# Patient Record
Sex: Female | Born: 1960 | Race: White | Hispanic: No | Marital: Married | Smoking: Former smoker
Health system: Southern US, Community
[De-identification: ages and names within clinical notes are randomized; demographics above are authoritative.]

## PROBLEM LIST (undated history)

## (undated) DIAGNOSIS — F419 Anxiety disorder, unspecified: Secondary | ICD-10-CM

## (undated) DIAGNOSIS — K219 Gastro-esophageal reflux disease without esophagitis: Secondary | ICD-10-CM

---

## 2016-09-07 ENCOUNTER — Other Ambulatory Visit: Payer: Self-pay

## 2016-09-07 ENCOUNTER — Telehealth: Payer: Self-pay

## 2016-09-07 DIAGNOSIS — Z1211 Encounter for screening for malignant neoplasm of colon: Secondary | ICD-10-CM

## 2016-09-07 NOTE — Telephone Encounter (Signed)
Gastroenterology Pre-Procedure Review  Request Date:  Requesting Physician: Dr.   PATIENT REVIEW QUESTIONS: The patient responded to the following health history questions as indicated:    1. Are you having any GI issues? no 2. Do you have a personal history of Polyps? no 3. Do you have a family history of Colon Cancer or Polyps? no 4. Diabetes Mellitus? no 5. Joint replacements in the past 12 months?no 6. Major health problems in the past 3 months?no 7. Any artificial heart valves, MVP, or defibrillator?no    MEDICATIONS & ALLERGIES:    Patient reports the following regarding taking any anticoagulation/antiplatelet therapy:   Plavix, Coumadin, Eliquis, Xarelto, Lovenox, Pradaxa, Brilinta, or Effient? no Aspirin? no  Patient confirms/reports the following medications:  No current outpatient prescriptions on file.   No current facility-administered medications for this visit.     Patient confirms/reports the following allergies:  Allergies  Allergen Reactions  . Codeine     Dizziness     No orders of the defined types were placed in this encounter.   AUTHORIZATION INFORMATION Primary Insurance: 1D#: Group #:  Secondary Insurance: 1D#: Group #:  SCHEDULE INFORMATION: Date: 11/09/16 Time: Location: [place]

## 2016-09-11 ENCOUNTER — Telehealth: Payer: Self-pay | Admitting: Gastroenterology

## 2016-09-11 NOTE — Telephone Encounter (Signed)
09/11/16 Spoke with Ulis Rias at [place] and NO prior auth required for Colonoscopy / Z12.11 screening

## 2016-11-05 ENCOUNTER — Telehealth: Payer: Self-pay | Admitting: Gastroenterology

## 2016-11-05 NOTE — Telephone Encounter (Signed)
Patient left a voice message that she needs to reschedule. Please call

## 2016-11-05 NOTE — Telephone Encounter (Signed)
Patient is returning your call regarding scheduling a colonoscopy. Please call

## 2016-11-06 NOTE — Telephone Encounter (Signed)
Patient changed procedure date to 7/6.  Mailed new prep packet.

## 2016-11-07 ENCOUNTER — Telehealth: Payer: Self-pay | Admitting: Gastroenterology

## 2016-11-07 NOTE — Telephone Encounter (Signed)
Patient needs to r/s her procedure again. She won't be back from vacation.

## 2016-11-15 ENCOUNTER — Encounter
Admission: RE | Disposition: A | Discharge status: Home / Self Care | Payer: Self-pay | Source: Ambulatory Visit | Attending: Gastroenterology

## 2016-11-15 ENCOUNTER — Ambulatory Visit: Payer: BC Managed Care – PPO | Admitting: Anesthesiology

## 2016-11-15 ENCOUNTER — Encounter: Payer: Self-pay | Admitting: *Deleted

## 2016-11-15 ENCOUNTER — Ambulatory Visit
Admission: RE | Admit: 2016-11-15 | Discharge: 2016-11-15 | Disposition: A | Discharge status: Home / Self Care | Payer: BC Managed Care – PPO | Source: Ambulatory Visit | Attending: Gastroenterology | Admitting: Gastroenterology

## 2016-11-15 DIAGNOSIS — K640 First degree hemorrhoids: Secondary | ICD-10-CM | POA: Diagnosis not present

## 2016-11-15 DIAGNOSIS — F419 Anxiety disorder, unspecified: Secondary | ICD-10-CM | POA: Diagnosis not present

## 2016-11-15 DIAGNOSIS — D122 Benign neoplasm of ascending colon: Secondary | ICD-10-CM | POA: Insufficient documentation

## 2016-11-15 DIAGNOSIS — Z885 Allergy status to narcotic agent status: Secondary | ICD-10-CM | POA: Diagnosis not present

## 2016-11-15 DIAGNOSIS — K219 Gastro-esophageal reflux disease without esophagitis: Secondary | ICD-10-CM | POA: Diagnosis not present

## 2016-11-15 DIAGNOSIS — Z87891 Personal history of nicotine dependence: Secondary | ICD-10-CM | POA: Diagnosis not present

## 2016-11-15 DIAGNOSIS — Z1211 Encounter for screening for malignant neoplasm of colon: Secondary | ICD-10-CM | POA: Diagnosis not present

## 2016-11-15 DIAGNOSIS — Z79899 Other long term (current) drug therapy: Secondary | ICD-10-CM | POA: Insufficient documentation

## 2016-11-15 HISTORY — DX: Gastro-esophageal reflux disease without esophagitis: K21.9

## 2016-11-15 HISTORY — PX: COLONOSCOPY WITH PROPOFOL: SHX5780

## 2016-11-15 HISTORY — DX: Anxiety disorder, unspecified: F41.9

## 2016-11-15 SURGERY — COLONOSCOPY WITH PROPOFOL
Anesthesia: General

## 2016-11-15 MED ORDER — LIDOCAINE HCL (CARDIAC) 20 MG/ML IV SOLN
INTRAVENOUS | Status: DC | PRN
  Administered 2016-11-15: 80 mg via INTRAVENOUS

## 2016-11-15 MED ORDER — PROPOFOL 500 MG/50ML IV EMUL
INTRAVENOUS | Status: AC
  Filled 2016-11-15: qty 50

## 2016-11-15 MED ORDER — LIDOCAINE HCL (PF) 2 % IJ SOLN
INTRAMUSCULAR | Status: AC
  Filled 2016-11-15: qty 2

## 2016-11-15 MED ORDER — PROPOFOL 10 MG/ML IV BOLUS
INTRAVENOUS | Status: DC | PRN
  Administered 2016-11-15: 60 mg via INTRAVENOUS

## 2016-11-15 MED ORDER — SODIUM CHLORIDE 0.9 % IV SOLN
INTRAVENOUS | Status: DC
  Administered 2016-11-15 (×3): via INTRAVENOUS

## 2016-11-15 MED ORDER — PROPOFOL 500 MG/50ML IV EMUL
INTRAVENOUS | Status: DC | PRN
  Administered 2016-11-15: 150 ug/kg/min via INTRAVENOUS

## 2016-11-15 MED ORDER — ONDANSETRON HCL 4 MG/2ML IJ SOLN
INTRAMUSCULAR | Status: AC
  Filled 2016-11-15: qty 2

## 2016-11-15 NOTE — Anesthesia Preprocedure Evaluation (Signed)
Anesthesia Evaluation  Patient identified by MRN, date of birth, ID band Patient awake    Reviewed: Allergy & Precautions, NPO status , Patient's Chart, lab work & pertinent test results  Airway Mallampati: III  TM Distance: <3 FB Neck ROM: Full    Dental  (+) Caps   Pulmonary former smoker,    Pulmonary exam normal        Cardiovascular negative cardio ROS Normal cardiovascular exam     Neuro/Psych Anxiety negative neurological ROS     GI/Hepatic Neg liver ROS, GERD  Medicated,  Endo/Other  negative endocrine ROS  Renal/GU negative Renal ROS  negative genitourinary   Musculoskeletal negative musculoskeletal ROS (+)   Abdominal Normal abdominal exam  (+)   Peds negative pediatric ROS (+)  Hematology negative hematology ROS (+)   Anesthesia Other Findings   Reproductive/Obstetrics                             Anesthesia Physical Anesthesia Plan  ASA: II  Anesthesia Plan: General   Post-op Pain Management:    Induction: Intravenous  PONV Risk Score and Plan:   Airway Management Planned: Nasal Cannula  Additional Equipment:   Intra-op Plan:   Post-operative Plan:   Informed Consent: I have reviewed the patients History and Physical, chart, labs and discussed the procedure including the risks, benefits and alternatives for the proposed anesthesia with the patient or authorized representative who has indicated his/her understanding and acceptance.   Dental advisory given  Plan Discussed with: CRNA and Surgeon  Anesthesia Plan Comments:         Anesthesia Quick Evaluation

## 2016-11-15 NOTE — Transfer of Care (Signed)
Immediate Anesthesia Transfer of Care Note  Patient: Traci Gillespie  Procedure(s) Performed: Procedure(s): COLONOSCOPY WITH PROPOFOL (N/A)  Patient Location: Endoscopy Unit  Anesthesia Type:General  Level of Consciousness: drowsy and patient cooperative  Airway & Oxygen Therapy: Patient Spontanous Breathing and Patient connected to nasal cannula oxygen  Post-op Assessment: Report given to RN and Post -op Vital signs reviewed and stable  Post vital signs: Reviewed and stable  Last Vitals:  Vitals:   11/15/16 1031  BP: 135/85  Pulse: 73  Resp: 18  Temp: (!) 36 C    Last Pain:  Vitals:   11/15/16 1031  TempSrc: Tympanic         Complications: No apparent anesthesia complications

## 2016-11-15 NOTE — Anesthesia Post-op Follow-up Note (Cosign Needed)
Anesthesia QCDR form completed.        

## 2016-11-15 NOTE — Op Note (Signed)
[facility] Gastroenterology Patient Name: Traci Gillespie Procedure Date: 11/15/2016 12:39 PM MRN: [id number] Account #: [id number] Date of Birth: [birth year] Admit Type: Outpatient Age: 56 Room: [facility] ENDO ROOM 4 Gender: Female Note Status: Finalized Procedure:            Colonoscopy Indications:          Screening for colorectal malignant neoplasm Providers:            Jonathon Bellows MD, MD Referring MD:         Kerin Perna MD, MD (Referring MD) Medicines:            Monitored Anesthesia Care Complications:        No immediate complications. Procedure:            Pre-Anesthesia Assessment:                       - Prior to the procedure, a History and Physical was                        performed, and patient medications, allergies and                        sensitivities were reviewed. The patient's tolerance of                        previous anesthesia was reviewed.                       - The risks and benefits of the procedure and the                        sedation options and risks were discussed with the                        patient. All questions were answered and informed                        consent was obtained.                       - ASA Grade Assessment: II - A patient with mild                        systemic disease.                       After obtaining informed consent, the colonoscope was                        passed under direct vision. Throughout the procedure,                        the patient's blood pressure, pulse, and oxygen                        saturations were monitored continuously. The                        Colonoscope was introduced through the anus and  advanced to the the cecum, identified by the                        appendiceal orifice, IC valve and transillumination.                        The colonoscopy was performed with ease. The patient                        tolerated the procedure well. The  quality of the bowel                        preparation was good. Findings:      A 5 mm polyp was found in the ascending colon. The polyp was sessile.       The polyp was removed with a cold snare. Resection and retrieval were       complete.      Non-bleeding internal hemorrhoids were found during retroflexion. The       hemorrhoids were small and Grade I (internal hemorrhoids that do not       prolapse).      The exam was otherwise without abnormality on direct and retroflexion       views. Impression:           - One 5 mm polyp in the ascending colon, removed with a                        cold snare. Resected and retrieved.                       - Non-bleeding internal hemorrhoids.                       - The examination was otherwise normal on direct and                        retroflexion views. Recommendation:       - Discharge patient to home (with escort).                       - Resume previous diet.                       - Continue present medications.                       - Await pathology results.                       - Repeat colonoscopy in 5-10 years for surveillance                        based on pathology results. Procedure Code(s):    --- Professional ---                       [phone], Colonoscopy, flexible; with removal of tumor(s),                        polyp(s), or other lesion(s) by snare technique Diagnosis Code(s):    --- Professional ---  Z12.11, Encounter for screening for malignant neoplasm                        of colon                       D12.2, Benign neoplasm of ascending colon                       K64.0, First degree hemorrhoids CPT copyright 2016 American Medical Association. All rights reserved. The codes documented in this report are preliminary and upon coder review may  be revised to meet current compliance requirements. Jonathon Bellows, MD Jonathon Bellows MD, MD 11/15/2016 1:12:27 PM This report has been signed  electronically. Number of Addenda: 0 Note Initiated On: 11/15/2016 12:39 PM Scope Withdrawal Time: 0 hours 16 minutes 59 seconds  Total Procedure Duration: 0 hours 25 minutes 21 seconds       [facility]

## 2016-11-15 NOTE — Anesthesia Postprocedure Evaluation (Signed)
Anesthesia Post Note  Patient: Museum/gallery conservator  Procedure(s) Performed: Procedure(s) (LRB): COLONOSCOPY WITH PROPOFOL (N/A)  Patient location during evaluation: Endoscopy Anesthesia Type: General Level of consciousness: awake and alert and oriented Pain management: pain level controlled Vital Signs Assessment: post-procedure vital signs reviewed and stable Respiratory status: spontaneous breathing, nonlabored ventilation and respiratory function stable Cardiovascular status: blood pressure returned to baseline and stable Postop Assessment: no signs of nausea or vomiting Anesthetic complications: no     Last Vitals:  Vitals:   11/15/16 1335 11/15/16 1345  BP: 118/60 129/74  Pulse: 74 72  Resp: 13 (!) 22  Temp:      Last Pain:  Vitals:   11/15/16 1315  TempSrc: Tympanic  PainSc: Asleep                 Theophilus Walz

## 2016-11-18 NOTE — H&P (Signed)
  Jonathon Bellows MD [address], [place], [place] [address] Phone: [phone] Fax : [phone]  Primary Care Physician:  Hortencia Pilar, MD Primary Gastroenterologist:  Dr. Jonathon Bellows   Pre-Procedure History & Physical: HPI:  Traci Gillespie is a 56 y.o. female is here for an colonoscopy.   Past Medical History:  Diagnosis Date  . Anxiety   . GERD (gastroesophageal reflux disease)     Past Surgical History:  Procedure Laterality Date  . CESAREAN SECTION      Prior to Admission medications   Medication Sig Start Date End Date Taking? Authorizing Provider  busPIRone (BUSPAR) 10 MG tablet Take 10 mg by mouth daily.   Yes [provider]  ranitidine (ZANTAC) 300 MG tablet  08/30/16  Yes [provider]  simvastatin (ZOCOR) 20 MG tablet  08/30/16   [provider]    Allergies as of 09/07/2016 - never reviewed  Allergen Reaction Noted  . Codeine  09/07/2016    History reviewed. No pertinent family history.  Social History   Social History  . Marital status: Married    Spouse name: N/A  . Number of children: N/A  . Years of education: N/A   Occupational History  . Not on file.   Social History Main Topics  . Smoking status: Former Research scientist (life sciences)  . Smokeless tobacco: Never Used  . Alcohol use Yes  . Drug use: No  . Sexual activity: Not on file   Other Topics Concern  . Not on file   Social History Narrative  . No narrative on file    Review of Systems: See HPI, otherwise negative ROS  Physical Exam: BP 129/74   Pulse 72   Temp 97.3 F (36.3 C) (Tympanic)   Resp (!) 22   Ht 5\' 9"  (1.753 m)   Wt 173 lb 12.8 oz (78.8 kg)   SpO2 97%   BMI 25.67 kg/m  General:   Alert,  pleasant and cooperative in NAD Head:  Normocephalic and atraumatic. Neck:  Supple; no masses or thyromegaly. Lungs:  Clear throughout to auscultation.    Heart:  Regular rate and rhythm. Abdomen:  Soft, nontender and nondistended. Normal bowel sounds, without  guarding, and without rebound.   Neurologic:  Alert and  oriented x4;  grossly normal neurologically.  Impression/Plan: Traci Gillespie is here for an colonoscopy to be performed for Screening colonoscopy average risk    Risks, benefits, limitations, and alternatives regarding  colonoscopy have been reviewed with the patient.  Questions have been answered.  All parties agreeable.  The note was written on 11/18/16 . Unsure why note from procedure date not visible.    Jonathon Bellows, MD  11/18/2016, 4:17 PM

## 2016-11-19 ENCOUNTER — Encounter: Payer: Self-pay | Admitting: Gastroenterology

## 2016-11-19 LAB — SURGICAL PATHOLOGY

## 2017-03-16 ENCOUNTER — Ambulatory Visit
Admission: EM | Admit: 2017-03-16 | Discharge: 2017-03-16 | Disposition: A | Discharge status: Home / Self Care | Payer: BC Managed Care – PPO | Attending: Family Medicine | Admitting: Family Medicine

## 2017-03-16 DIAGNOSIS — N3000 Acute cystitis without hematuria: Secondary | ICD-10-CM

## 2017-03-16 LAB — URINALYSIS, COMPLETE (UACMP) WITH MICROSCOPIC
Bacteria, UA: NONE SEEN
Bilirubin Urine: NEGATIVE
GLUCOSE, UA: NEGATIVE mg/dL
HGB URINE DIPSTICK: NEGATIVE
Ketones, ur: NEGATIVE mg/dL
Nitrite: NEGATIVE
PH: 6.5 (ref 5.0–8.0)
PROTEIN: NEGATIVE mg/dL
RBC / HPF: NONE SEEN RBC/hpf (ref 0–5)
Specific Gravity, Urine: 1.01 (ref 1.005–1.030)

## 2017-03-16 MED ORDER — CEPHALEXIN 500 MG PO CAPS: 500.0000 mg | ORAL_CAPSULE | Freq: Two times a day (BID) | ORAL | 0 refills | Status: DC

## 2017-03-16 NOTE — ED Triage Notes (Signed)
Per patient frequent urination / hematuria and painful urination.

## 2017-03-16 NOTE — ED Provider Notes (Signed)
[facility]    CSN: [id number] Arrival date & time: 03/16/17  1009  History   Chief Complaint Chief Complaint  Patient presents with  . Urinary Tract Infection   HPI 56 year old female presents with concerns for UTI.  She states that on Wednesday she developed frequency, urgency, and dysuria. Her symptoms have continued to progress. She reports that she's had some hematuria as well. No associated fevers or chills. No flank pain. No abdominal pain. She states that she took a dose of antibiotic that was left over at home without significant improvement. No known exacerbating or relieving factors. No other associated symptoms. No other complaints or concerns this time.  Past Medical History:  Diagnosis Date  . Anxiety   . GERD (gastroesophageal reflux disease)    Past Surgical History:  Procedure Laterality Date  . CESAREAN SECTION    . COLONOSCOPY WITH PROPOFOL N/A 11/15/2016   Procedure: COLONOSCOPY WITH PROPOFOL;  Surgeon: Jonathon Bellows, MD;  Location: [facility] ENDOSCOPY;  Service: Endoscopy;  Laterality: N/A;   OB History    No data available     Home Medications    Prior to Admission medications   Medication Sig Start Date End Date Taking? Authorizing Provider  busPIRone (BUSPAR) 10 MG tablet Take 10 mg by mouth daily.   Yes [provider]  ranitidine (ZANTAC) 300 MG tablet  08/30/16  Yes [provider]  simvastatin (ZOCOR) 20 MG tablet  08/30/16  Yes [provider]  cephALEXin (KEFLEX) 500 MG capsule Take 1 capsule (500 mg total) by mouth 2 (two) times daily. 03/16/17   Coral Spikes, DO   Family History No Known Problems Daughter    Diabetes type II Father    Stroke Father    Anxiety Maternal Grandfather    Alzheimer's disease Mother    Breast cancer Paternal Grandmother    Alzheimer's disease Sister Ivin Booty   No Known Problems Sister Lelon Frohlich   Diabetes type II Sister Beth   No Known Problems Son     Social  History Social History  Substance Use Topics  . Smoking status: Former Research scientist (life sciences)  . Smokeless tobacco: Never Used  . Alcohol use Yes   Allergies   Codeine   Review of Systems Review of Systems  Constitutional: Negative.   Gastrointestinal: Negative.   Genitourinary: Positive for dysuria, frequency, hematuria and urgency.  All other systems reviewed and are negative.  Physical Exam Triage Vital Signs ED Triage Vitals  Enc Vitals Group     BP 03/16/17 1028 (!) 153/90     Pulse Rate 03/16/17 1028 77     Resp 03/16/17 1028 16     Temp 03/16/17 1028 98.4 F (36.9 C)     Temp Source 03/16/17 1028 Oral     SpO2 03/16/17 1028 99 %     Weight 03/16/17 1029 181 lb (82.1 kg)     Height 03/16/17 1029 5\' 9"  (1.753 m)     Head Circumference --      Peak Flow --      Pain Score 03/16/17 1024 1     Pain Loc --      Pain Edu? --      Excl. in [place]? --    Updated Vital Signs BP (!) 153/90 (BP Location: Left Arm)   Pulse 77   Temp 98.4 F (36.9 C) (Oral)   Resp 16   Ht 5\' 9"  (1.753 m)   Wt 181 lb (82.1 kg)   SpO2 99%  BMI 26.73 kg/m   Physical Exam  Constitutional: She is oriented to person, place, and time. She appears well-developed and well-nourished. No distress.  HENT:  Head: Normocephalic and atraumatic.  Nose: Nose normal.  Mouth/Throat: Oropharynx is clear and moist. No oropharyngeal exudate.  Normal TM's bilaterally.   Eyes: Conjunctivae are normal. No scleral icterus.  Neck: Neck supple. No thyromegaly present.  Cardiovascular: Normal rate and regular rhythm.   No murmur heard. Pulmonary/Chest: Effort normal and breath sounds normal. She has no wheezes. She has no rales.  Abdominal: Soft. She exhibits no distension. There is no tenderness. There is no rebound and no guarding.  Musculoskeletal: Normal range of motion. She exhibits no edema.  Lymphadenopathy:    She has no cervical adenopathy.  Neurological: She is alert and oriented to person, place, and time.   Skin: Skin is warm and dry. No rash noted.  Psychiatric: She has a normal mood and affect.  Vitals reviewed.  UC Treatments / Results  Labs (all labs ordered are listed, but only abnormal results are displayed) Labs Reviewed  URINALYSIS, COMPLETE (UACMP) WITH MICROSCOPIC - Abnormal; Notable for the following:       Result Value   Color, Urine STRAW (*)    Leukocytes, UA TRACE (*)    Squamous Epithelial / LPF 0-5 (*)    All other components within normal limits  URINE CULTURE   EKG  EKG Interpretation None       Radiology No results found.  Procedures Procedures (including critical care time)  Medications Ordered in UC Medications - No data to display   Initial Impression / Assessment and Plan / UC Course  I have reviewed the triage vital signs and the nursing notes.  Pertinent labs & imaging results that were available during my care of the patient were reviewed by me and considered in my medical decision making (see chart for details).     56 year old female presents with signs and symptoms of UTI. Culturing urine. Treating empirically with Keflex while awaiting culture. Final Clinical Impressions(s) / UC Diagnoses   Final diagnoses:  Acute cystitis without hematuria   New Prescriptions Discharge Medication List as of 03/16/2017 10:56 AM    START taking these medications   Details  cephALEXin (KEFLEX) 500 MG capsule Take 1 capsule (500 mg total) by mouth 2 (two) times daily., Starting Sat 03/16/2017, Normal       Controlled Substance Prescriptions [place] Controlled Substance Registry consulted? Not Applicable   Coral Spikes, DO 03/16/17 1101

## 2017-03-16 NOTE — Discharge Instructions (Signed)
Antibiotic as prescribed.  Take care  Dr. Cartier Mapel  

## 2017-03-19 LAB — URINE CULTURE: Culture: 60000 — AB

## 2018-04-15 ENCOUNTER — Other Ambulatory Visit: Payer: Self-pay | Admitting: Family Medicine

## 2018-04-15 DIAGNOSIS — Z1231 Encounter for screening mammogram for malignant neoplasm of breast: Secondary | ICD-10-CM

## 2018-04-16 ENCOUNTER — Ambulatory Visit
Admission: RE | Admit: 2018-04-16 | Discharge: 2018-04-16 | Disposition: A | Discharge status: Home / Self Care | Payer: BC Managed Care – PPO | Source: Ambulatory Visit | Attending: Family Medicine | Admitting: Family Medicine

## 2018-04-16 DIAGNOSIS — Z1231 Encounter for screening mammogram for malignant neoplasm of breast: Secondary | ICD-10-CM | POA: Diagnosis not present

## 2019-12-06 DIAGNOSIS — E119 Type 2 diabetes mellitus without complications: Secondary | ICD-10-CM | POA: Insufficient documentation

## 2020-03-09 ENCOUNTER — Other Ambulatory Visit: Payer: Self-pay | Admitting: Family Medicine

## 2020-03-09 DIAGNOSIS — Z1231 Encounter for screening mammogram for malignant neoplasm of breast: Secondary | ICD-10-CM

## 2020-04-13 ENCOUNTER — Ambulatory Visit
Admission: RE | Admit: 2020-04-13 | Discharge: 2020-04-13 | Disposition: A | Discharge status: Home / Self Care | Payer: BC Managed Care – PPO | Source: Ambulatory Visit | Attending: Family Medicine | Admitting: Family Medicine

## 2020-04-13 ENCOUNTER — Other Ambulatory Visit: Payer: Self-pay

## 2020-04-13 DIAGNOSIS — Z1231 Encounter for screening mammogram for malignant neoplasm of breast: Secondary | ICD-10-CM | POA: Insufficient documentation

## 2020-06-14 ENCOUNTER — Ambulatory Visit (INDEPENDENT_AMBULATORY_CARE_PROVIDER_SITE_OTHER): Payer: BC Managed Care – PPO | Admitting: Dermatology

## 2020-06-14 ENCOUNTER — Other Ambulatory Visit: Payer: Self-pay

## 2020-06-14 DIAGNOSIS — L918 Other hypertrophic disorders of the skin: Secondary | ICD-10-CM | POA: Diagnosis not present

## 2020-06-14 DIAGNOSIS — L820 Inflamed seborrheic keratosis: Secondary | ICD-10-CM | POA: Diagnosis not present

## 2020-06-14 DIAGNOSIS — L219 Seborrheic dermatitis, unspecified: Secondary | ICD-10-CM

## 2020-06-14 DIAGNOSIS — D225 Melanocytic nevi of trunk: Secondary | ICD-10-CM

## 2020-06-14 DIAGNOSIS — L578 Other skin changes due to chronic exposure to nonionizing radiation: Secondary | ICD-10-CM

## 2020-06-14 DIAGNOSIS — D1800 Hemangioma unspecified site: Secondary | ICD-10-CM

## 2020-06-14 DIAGNOSIS — L821 Other seborrheic keratosis: Secondary | ICD-10-CM

## 2020-06-14 DIAGNOSIS — L738 Other specified follicular disorders: Secondary | ICD-10-CM | POA: Diagnosis not present

## 2020-06-14 DIAGNOSIS — L853 Xerosis cutis: Secondary | ICD-10-CM

## 2020-06-14 DIAGNOSIS — Z1283 Encounter for screening for malignant neoplasm of skin: Secondary | ICD-10-CM | POA: Diagnosis not present

## 2020-06-14 DIAGNOSIS — L814 Other melanin hyperpigmentation: Secondary | ICD-10-CM

## 2020-06-14 DIAGNOSIS — L720 Epidermal cyst: Secondary | ICD-10-CM | POA: Diagnosis not present

## 2020-06-14 DIAGNOSIS — D229 Melanocytic nevi, unspecified: Secondary | ICD-10-CM

## 2020-06-14 MED ORDER — CLOBETASOL PROPIONATE 0.05 % EX SOLN: 1.0000 "application " | Freq: Two times a day (BID) | CUTANEOUS | 3 refills | Status: AC | PRN

## 2020-06-14 NOTE — Progress Notes (Signed)
Follow-Up Visit   Subjective  Traci Gillespie is a 60 y.o. female who presents for the following: TBSE (Patient is here for tbse. She has not been here in 2 years and was not able to find history of any removals prior. Patient reports no history of skin cancer. Patient states she had something removed in nose area. She denies any concerns with nasal area.).  She has an irritated growth by nose and forehead.   Patient states she has a rough crusty area on left forehead and area on left side of nose. States she noticed 6 months to a year. Patient also has concerns about  raised area on center chest she noticed 6 month to 1 year. Reports mole on right side bra line that gets irritated sometimes. Also reports she has a bump on back of head that has been there for several years and gets irritated and itchy when she eats certain foods like nuts.  Patient here for full body skin exam and skin cancer screening.   The following portions of the chart were reviewed this encounter and updated as appropriate:        Objective  Well appearing patient in no apparent distress; mood and affect are within normal limits.  A full examination was performed including scalp, head, eyes, ears, nose, lips, neck, chest, axillae, abdomen, back, buttocks, bilateral upper extremities, bilateral lower extremities, hands, feet, fingers, toes, fingernails, and toenails. All findings within normal limits unless otherwise noted below.  Objective  left paranasal x 1, left upper forehead x 1 (2): Erythematous keratotic or waxy stuck-on papule   Objective  face: Small yellow papules with a central dell.   Objective  Medial chest: 5 mm firm SQ nodule  Objective  right lower axilla: Fleshy, skin-colored pedunculated papule    Objective  occipital scalp: Pink patches with greasy scale.   Objective  bilateral legs: Dry skin on bilateral legs    Assessment & Plan  Inflamed seborrheic keratosis (2) left  paranasal x 1, left upper forehead x 1  Destruction of lesion - left paranasal x 1, left upper forehead x 1  Destruction method: cryotherapy   Informed consent: discussed and consent obtained   Lesion destroyed using liquid nitrogen: Yes   Region frozen until ice ball extended beyond lesion: Yes   Outcome: patient tolerated procedure well with no complications   Post-procedure details: wound care instructions given    Sebaceous hyperplasia of face face  Benign-appearing.  Observation.  Call clinic for new or changing    Epidermal cyst Medial chest  Benign, observe.    Acrochordon right lower axilla  Irritated Discussed treatment removal today, may be noncovered by insurance - patient deferred at this time.    Seborrheic dermatitis occipital scalp  Start Clobetasol solutions 0.05 % use 1 to 2 drops twice a day as needed applying to scalp.  Avoid applying to face, groin, and axilla. Use as directed. Risk of skin atrophy with long-term use reviewed.   Seborrheic Dermatitis  -  is a chronic persistent rash characterized by pinkness and scaling most commonly of the mid face but also can occur on the scalp (dandruff), ears; mid chest and mid back. It tends to be exacerbated by stress and cooler weather.  People who have neurologic disease may experience new onset or exacerbation of existing seborrheic dermatitis.  The condition is not curable but treatable and can be controlled.   Topical steroids (such as triamcinolone, fluocinolone, fluocinonide, mometasone, clobetasol, halobetasol, betamethasone,  hydrocortisone) can cause thinning and lightening of the skin if they are used for too long in the same area. Your physician has selected the right strength medicine for your problem and area affected on the body. Please use your medication only as directed by your physician to prevent side effects.    Ordered Medications: clobetasol (TEMOVATE) 0.05 % external solution  Xerosis  cutis bilateral legs  Recommend mild soap and moisturizing cream 1-2 times daily.   apply to bilateral legs    Lentigines - Scattered tan macules - Discussed due to sun exposure - Benign, observe - Call for any changes  Seborrheic Keratoses Back, shoulders,  - Stuck-on, waxy, tan-brown papules and plaques  - Discussed benign etiology and prognosis. - Observe - Call for any changes  Melanocytic Nevi - Tan-brown and/or pink-flesh-colored symmetric macules and papules - Benign appearing on exam today - Observation - Call clinic for new or changing moles - Recommend daily use of broad spectrum spf 30+ sunscreen to sun-exposed areas.   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  - Call for new or changing lesions.  Skin cancer screening performed today.  Return for 2 year TBSE and as needed .   I, Ruthell Rummage, CMA, am acting as scribe for Brendolyn Patty, MD.  Documentation: I have reviewed the above documentation for accuracy and completeness, and I agree with the above.  Brendolyn Patty MD

## 2020-06-14 NOTE — Patient Instructions (Addendum)
Melanoma ABCDEs  Melanoma is the most dangerous type of skin cancer, and is the leading cause of death from skin disease.  You are more likely to develop melanoma if you:  Have light-colored skin, light-colored eyes, or red or blond hair  Spend a lot of time in the sun  Tan regularly, either outdoors or in a tanning bed  Have had blistering sunburns, especially during childhood  Have a close family member who has had a melanoma  Have atypical moles or large birthmarks  Early detection of melanoma is key since treatment is typically straightforward and cure rates are extremely high if we catch it early.   The first sign of melanoma is often a change in a mole or a new dark spot.  The ABCDE system is a way of remembering the signs of melanoma.  A for asymmetry:  The two halves do not match. B for border:  The edges of the growth are irregular. C for color:  A mixture of colors are present instead of an even brown color. D for diameter:  Melanomas are usually (but not always) greater than 11mm - the size of a pencil eraser. E for evolution:  The spot keeps changing in size, shape, and color.  Please check your skin once per month between visits. You can use a small mirror in front and a large mirror behind you to keep an eye on the back side or your body.   If you see any new or changing lesions before your next follow-up, please call to schedule a visit.  Please continue daily skin protection including broad spectrum sunscreen SPF 30+ to sun-exposed areas, reapplying every 2 hours as needed when you're outdoors.   Seborrheic Keratosis  What causes seborrheic keratoses? Seborrheic keratoses are harmless, common skin growths that first appear during adult life.  As time goes by, more growths appear.  Some people may develop a large number of them.  Seborrheic keratoses appear on both covered and uncovered body parts.  They are not caused by sunlight.  The tendency to develop seborrheic  keratoses can be inherited.  They vary in color from skin-colored to gray, brown, or even black.  They can be either smooth or have a rough, warty surface.   Seborrheic keratoses are superficial and look as if they were stuck on the skin.  Under the microscope this type of keratosis looks like layers upon layers of skin.  That is why at times the top layer may seem to fall off, but the rest of the growth remains and re-grows.    Treatment Seborrheic keratoses do not need to be treated, but can easily be removed in the office.  Seborrheic keratoses often cause symptoms when they rub on clothing or jewelry.  Lesions can be in the way of shaving.  If they become inflamed, they can cause itching, soreness, or burning.  Removal of a seborrheic keratosis can be accomplished by freezing, burning, or surgery. If any spot bleeds, scabs, or grows rapidly, please return to have it checked, as these can be an indication of a skin cancer.  Cryotherapy Aftercare  . Wash gently with soap and water everyday.   Marland Kitchen Apply Vaseline and Band-Aid daily until healed.  Topical steroids (such as triamcinolone, fluocinolone, fluocinonide, mometasone, clobetasol, halobetasol, betamethasone, hydrocortisone) can cause thinning and lightening of the skin if they are used for too long in the same area. Your physician has selected the right strength medicine for your problem and area  affected on the body. Please use your medication only as directed by your physician to prevent side effects.   Dry Skin Care  What causes dry skin?  Dry skin is common and results from inadequate moisture in the outer skin layers. Dry skin usually results from the excessive loss of moisture from the skin surface. This occurs due to two major factors: 1. Normally the skin's oil glands deposit a layer of oil on the skin's surface. This layer of oil prevents the loss of moisture from the skin. Exposure to soaps, cleaners, solvents, and disinfectants  removes this oily film, allowing water to escape. 2. Water loss from the skin increases when the humidity is low. During winter months we spend a lot of time indoors where the air is heated. Heated air has very low humidity. This also contributes to dry skin.  A tendency for dry skin may accompany such disorders as eczema. Also, as people age, the number of functioning oil glands decreases, and the tendency toward dry skin can be a sensation of skin tightness when emerging from the shower.  How do I manage dry skin?  1. Humidify your environment. This can be accomplished by using a humidifier in your bedroom at night during winter months. 2. Bathing can actually put moisture back into your skin if done right. Take the following steps while bathing to sooth dry skin:  Avoid hot water, which only dries the skin and makes itching worse. Use warm water.  Avoid washcloths or extensive rubbing or scrubbing.  Use mild soaps like unscented Dove, Oil of Olay, Cetaphil, Basis, or CeraVe.  If you take baths rather than showers, rinse off soap residue with clean water before getting out of tub.  Once out of the shower/tub, pat dry gently with a soft towel. Leave your skin damp.  While still damp, apply any medicated ointment/cream you were prescribed to the affected areas. After you apply your medicated ointment/cream, then apply your moisturizer to your whole body.This is the most important step in dry skin care. If this is omitted, your skin will continue to be dry.  The choice of moisturizer is also very important. In general, lotion will not provider enough moisture to severely dry skin because it is water based. You should use an ointment or cream. Moisturizers should also be unscented. Good choices include Vaseline (plain petrolatum), Aquaphor, Cetaphil, CeraVe, Vanicream, DML Forte, Aveeno moisture, or Eucerin Cream.  Bath oils can be helpful, but do not replace the application of moisturizer  after the bath. In addition, they make the tub slippery causing an increased risk for falls. Therefore, we do not recommend their use.

## 2021-06-02 ENCOUNTER — Other Ambulatory Visit: Payer: Self-pay | Admitting: Family Medicine

## 2021-06-02 DIAGNOSIS — Z1231 Encounter for screening mammogram for malignant neoplasm of breast: Secondary | ICD-10-CM

## 2021-07-11 ENCOUNTER — Ambulatory Visit
Admission: RE | Admit: 2021-07-11 | Discharge: 2021-07-11 | Disposition: A | Discharge status: Home / Self Care | Payer: BC Managed Care – PPO | Source: Ambulatory Visit | Attending: Family Medicine | Admitting: Family Medicine

## 2021-07-11 ENCOUNTER — Other Ambulatory Visit: Payer: Self-pay

## 2021-07-11 DIAGNOSIS — Z1231 Encounter for screening mammogram for malignant neoplasm of breast: Secondary | ICD-10-CM | POA: Diagnosis present

## 2021-11-13 ENCOUNTER — Ambulatory Visit: Payer: BC Managed Care – PPO | Admitting: Dermatology

## 2021-11-13 DIAGNOSIS — L814 Other melanin hyperpigmentation: Secondary | ICD-10-CM

## 2021-11-13 DIAGNOSIS — L738 Other specified follicular disorders: Secondary | ICD-10-CM

## 2021-11-13 DIAGNOSIS — D1800 Hemangioma unspecified site: Secondary | ICD-10-CM | POA: Diagnosis not present

## 2021-11-13 DIAGNOSIS — L820 Inflamed seborrheic keratosis: Secondary | ICD-10-CM

## 2021-11-13 NOTE — Patient Instructions (Addendum)
Recommend starting moisturizer with exfoliant (Urea, Salicylic acid, or Lactic acid) one to two times daily to help smooth rough and bumpy skin.  OTC options include Cetaphil Rough and Bumpy lotion (Urea), Eucerin Roughness Relief lotion or spot treatment cream (Urea), CeraVe SA lotion/cream for Rough and Bumpy skin (Sal Acid), Gold Bond Rough and Bumpy cream (Sal Acid), and AmLactin 12% lotion/cream (Lactic Acid).  If applying in morning, also apply sunscreen to sun-exposed areas, since these exfoliating moisturizers can increase sensitivity to sun.   Seborrheic Keratosis  What causes seborrheic keratoses? Seborrheic keratoses are harmless, common skin growths that first appear during adult life.  As time goes by, more growths appear.  Some people may develop a large number of them.  Seborrheic keratoses appear on both covered and uncovered body parts.  They are not caused by sunlight.  The tendency to develop seborrheic keratoses can be inherited.  They vary in color from skin-colored to gray, brown, or even black.  They can be either smooth or have a rough, warty surface.   Seborrheic keratoses are superficial and look as if they were stuck on the skin.  Under the microscope this type of keratosis looks like layers upon layers of skin.  That is why at times the top layer may seem to fall off, but the rest of the growth remains and re-grows.    Treatment Seborrheic keratoses do not need to be treated, but can easily be removed in the office.  Seborrheic keratoses often cause symptoms when they rub on clothing or jewelry.  Lesions can be in the way of shaving.  If they become inflamed, they can cause itching, soreness, or burning.  Removal of a seborrheic keratosis can be accomplished by freezing, burning, or surgery. If any spot bleeds, scabs, or grows rapidly, please return to have it checked, as these can be an indication of a skin cancer.  Due to recent changes in healthcare laws, you may see  results of your pathology and/or laboratory studies on MyChart before the doctors have had a chance to review them. We understand that in some cases there may be results that are confusing or concerning to you. Please understand that not all results are received at the same time and often the doctors may need to interpret multiple results in order to provide you with the best plan of care or course of treatment. Therefore, we ask that you please give us 2 business days to thoroughly review all your results before contacting the office for clarification. Should we see a critical lab result, you will be contacted sooner.   If You Need Anything After Your Visit  If you have any questions or concerns for your doctor, please call our main line at [phone] and press option 4 to reach your doctor's medical assistant. If no one answers, please leave a voicemail as directed and we will return your call as soon as possible. Messages left after 4 pm will be answered the following business day.   You may also send us a message via MyChart. We typically respond to MyChart messages within 1-2 business days.  For prescription refills, please ask your pharmacy to contact our office. Our fax number is [phone].  If you have an urgent issue when the clinic is closed that cannot wait until the next business day, you can page your doctor at the number below.    Please note that while we do our best to be available for urgent issues outside of   office hours, we are not available 24/7.   If you have an urgent issue and are unable to reach us, you may choose to seek medical care at your doctor's office, retail clinic, urgent care center, or emergency room.  If you have a medical emergency, please immediately call 911 or go to the emergency department.  Pager Numbers  - Dr. Kowalski: [phone]  - Dr. Moye: [phone]  - Dr. Stewart: [phone]  In the event of inclement weather, please call our main  line at [phone] for an update on the status of any delays or closures.  Dermatology Medication Tips: Please keep the boxes that topical medications come in in order to help keep track of the instructions about where and how to use these. Pharmacies typically print the medication instructions only on the boxes and not directly on the medication tubes.   If your medication is too expensive, please contact our office at [phone] option 4 or send us a message through MyChart.   We are unable to tell what your co-pay for medications will be in advance as this is different depending on your insurance coverage. However, we may be able to find a substitute medication at lower cost or fill out paperwork to get insurance to cover a needed medication.   If a prior authorization is required to get your medication covered by your insurance company, please allow us 1-2 business days to complete this process.  Drug prices often vary depending on where the prescription is filled and some pharmacies may offer cheaper prices.  The website www.goodrx.com contains coupons for medications through different pharmacies. The prices here do not account for what the cost may be with help from insurance (it may be cheaper with your insurance), but the website can give you the price if you did not use any insurance.  - You can print the associated coupon and take it with your prescription to the pharmacy.  - You may also stop by our office during regular business hours and pick up a GoodRx coupon card.  - If you need your prescription sent electronically to a different pharmacy, notify our office through [facility] MyChart or by phone at [phone] option 4.     Si Usted Necesita Algo Despus de Su Visita  Tambin puede enviarnos un mensaje a travs de MyChart. Por lo general respondemos a los mensajes de MyChart en el transcurso de 1 a 2 das hbiles.  Para renovar recetas, por favor pida a su farmacia que  se ponga en contacto con nuestra oficina. Nuestro nmero de fax es el [phone].  Si tiene un asunto urgente cuando la clnica est cerrada y que no puede esperar hasta el siguiente da hbil, puede llamar/localizar a su doctor(a) al nmero que aparece a continuacin.   Por favor, tenga en cuenta que aunque hacemos todo lo posible para estar disponibles para asuntos urgentes fuera del horario de oficina, no estamos disponibles las 24 horas del da, los 7 das de la semana.   Si tiene un problema urgente y no puede comunicarse con nosotros, puede optar por buscar atencin mdica  en el consultorio de su doctor(a), en una clnica privada, en un centro de atencin urgente o en una sala de emergencias.  Si tiene una emergencia mdica, por favor llame inmediatamente al 911 o vaya a la sala de emergencias.  Nmeros de bper  - Dr. Kowalski: [phone]  - Dra. Moye: [phone]  - Dra. Stewart: [phone]  En caso de inclemencias   del tiempo, por favor llame a nuestra lnea principal al [phone] para una actualizacin sobre el estado de cualquier retraso o cierre.  Consejos para la medicacin en dermatologa: Por favor, guarde las cajas en las que vienen los medicamentos de uso tpico para ayudarle a seguir las instrucciones sobre dnde y cmo usarlos. Las farmacias generalmente imprimen las instrucciones del medicamento slo en las cajas y no directamente en los tubos del medicamento.   Si su medicamento es muy caro, por favor, pngase en contacto con nuestra oficina llamando al [phone] y presione la opcin 4 o envenos un mensaje a travs de MyChart.   No podemos decirle cul ser su copago por los medicamentos por adelantado ya que esto es diferente dependiendo de la cobertura de su seguro. Sin embargo, es posible que podamos encontrar un medicamento sustituto a menor costo o llenar un formulario para que el seguro cubra el medicamento que se considera necesario.   Si se  requiere una autorizacin previa para que su compaa de seguros cubra su medicamento, por favor permtanos de 1 a 2 das hbiles para completar este proceso.  Los precios de los medicamentos varan con frecuencia dependiendo del lugar de dnde se surte la receta y alguna farmacias pueden ofrecer precios ms baratos.  El sitio web www.goodrx.com tiene cupones para medicamentos de diferentes farmacias. Los precios aqu no tienen en cuenta lo que podra costar con la ayuda del seguro (puede ser ms barato con su seguro), pero el sitio web puede darle el precio si no utiliz ningn seguro.  - Puede imprimir el cupn correspondiente y llevarlo con su receta a la farmacia.  - Tambin puede pasar por nuestra oficina durante el horario de atencin regular y recoger una tarjeta de cupones de GoodRx.  - Si necesita que su receta se enve electrnicamente a una farmacia diferente, informe a nuestra oficina a travs de MyChart de [place] o por telfono llamando al [phone] y presione la opcin 4.  

## 2021-11-13 NOTE — Progress Notes (Signed)
   Follow-Up Visit   Subjective  Traci Gillespie is a 61 y.o. female who presents for the following: check spot (R infra ocular, ~57yr dried up over past 2 weeks and fell off).  The patient has spots, moles and lesions to be evaluated, some may be new or changing and the patient has concerns that these could be cancer.  The following portions of the chart were reviewed this encounter and updated as appropriate:       Review of Systems:  No other skin or systemic complaints except as noted in HPI or Assessment and Plan.  Objective  Well appearing patient in no apparent distress; mood and affect are within normal limits.  A focused examination was performed including face. Relevant physical exam findings are noted in the Assessment and Plan.  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 - Call for any changes  Inflamed seborrheic keratosis R infraocular  With self-resolution Benign, observe Sample of Eucerin roughness relief given to apply to aa Discussed LN2 if lesion recurs and is bothersome   Return in about 7 months (around 06/15/2022) for TBSE.  I, SOthelia Pulling RMA, am acting as scribe for TBrendolyn Patty MD .  Documentation: I have reviewed the above documentation for accuracy and completeness, and I agree with the above.  TBrendolyn PattyMD

## 2021-12-03 IMAGING — MG DIGITAL SCREENING BILAT W/ TOMO W/ CAD
8 series · 8 of 24 positions shown · non-contrast
Comparison: Previous exam(s).

CLINICAL DATA: Screening.

EXAM:
DIGITAL SCREENING BILATERAL MAMMOGRAM WITH TOMO AND CAD

[L CC synth-2D]
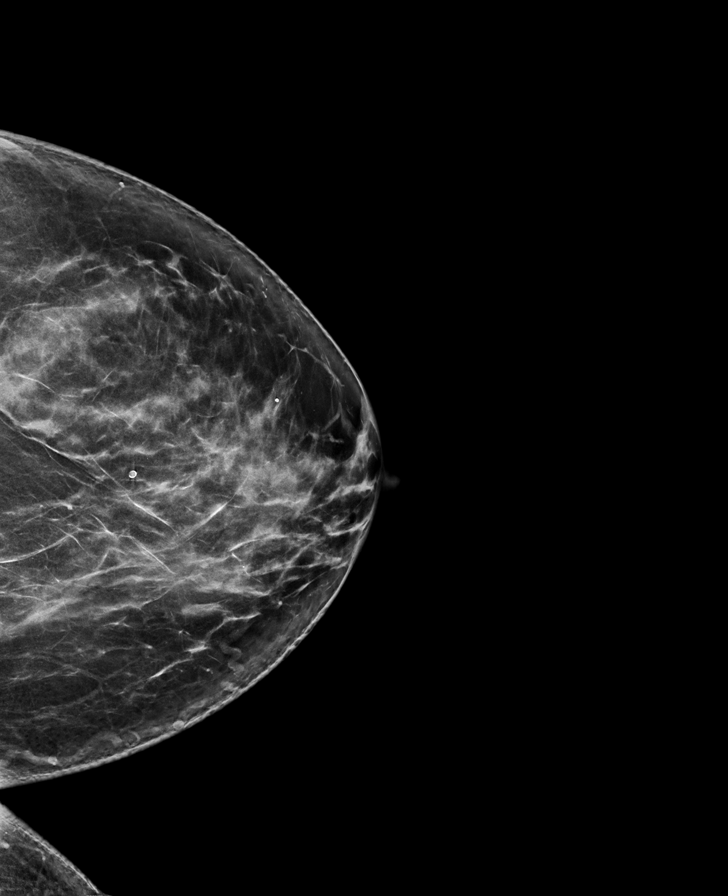

[L MLO synth-2D]
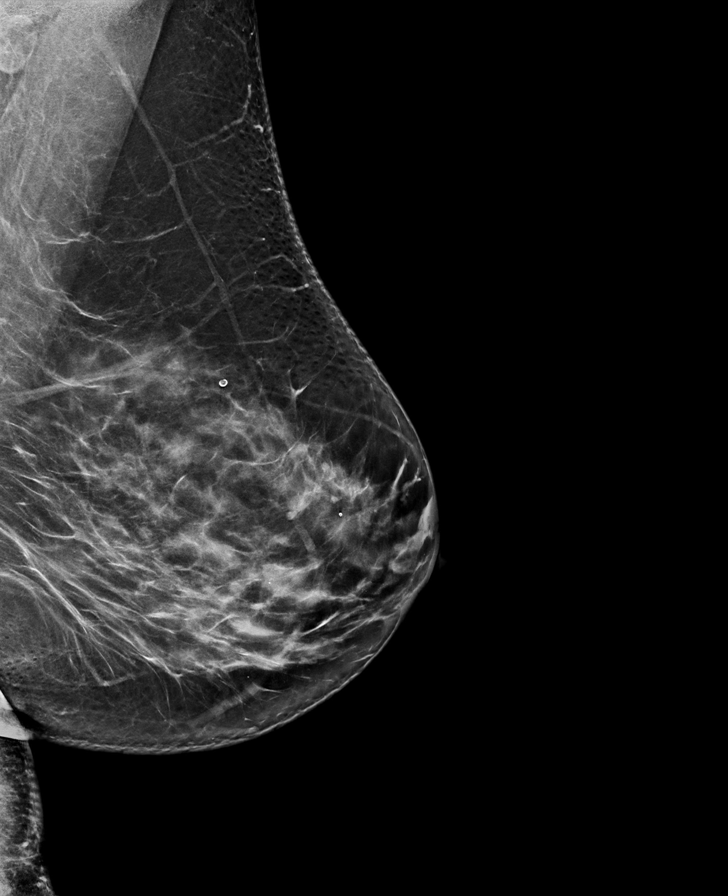

[R CC synth-2D]
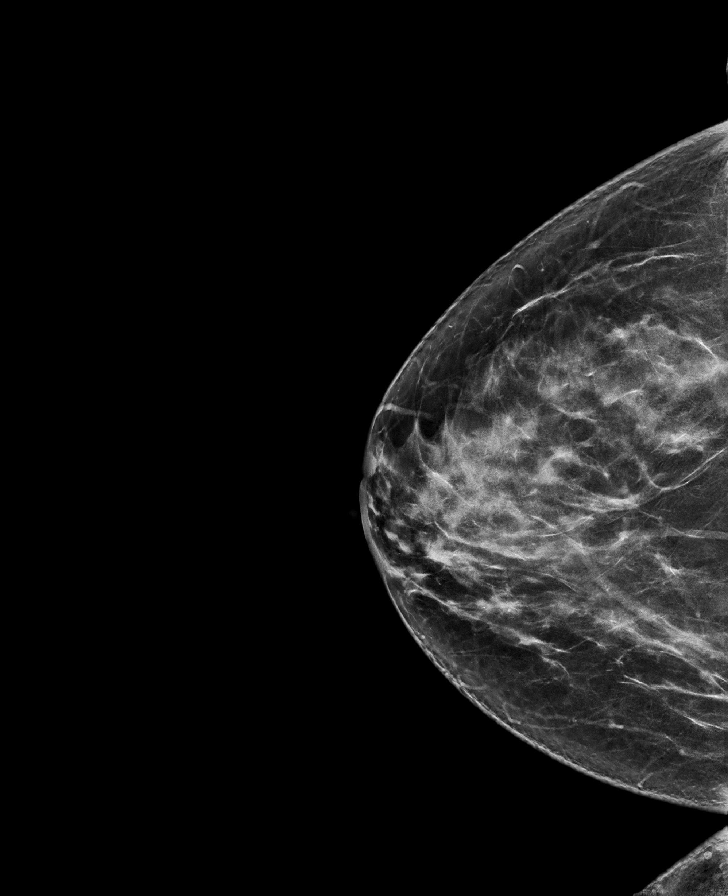

[R MLO synth-2D]
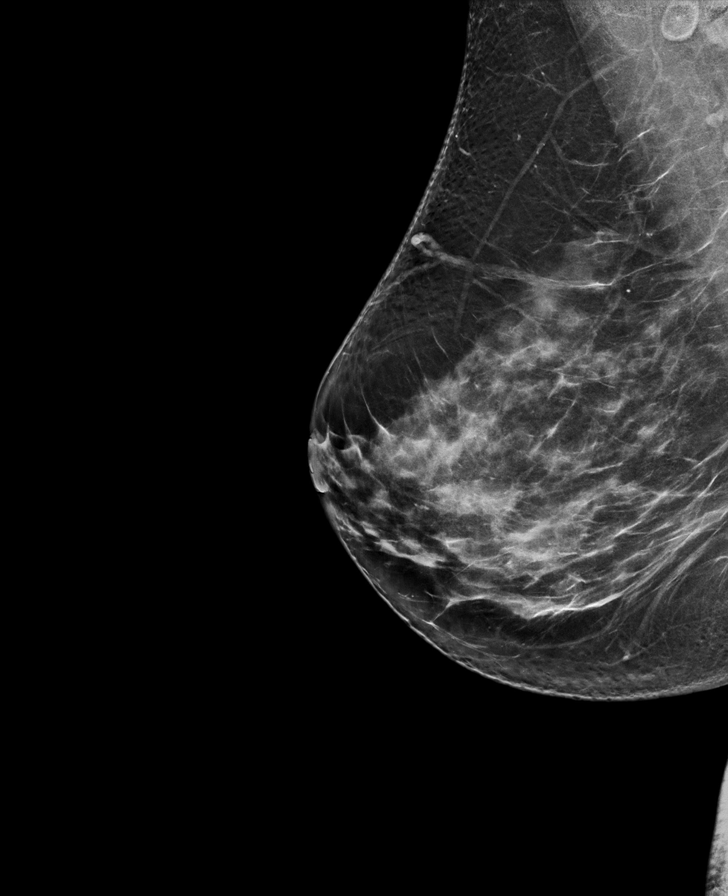

[R CC tomo · tomo slice 39/77.0]
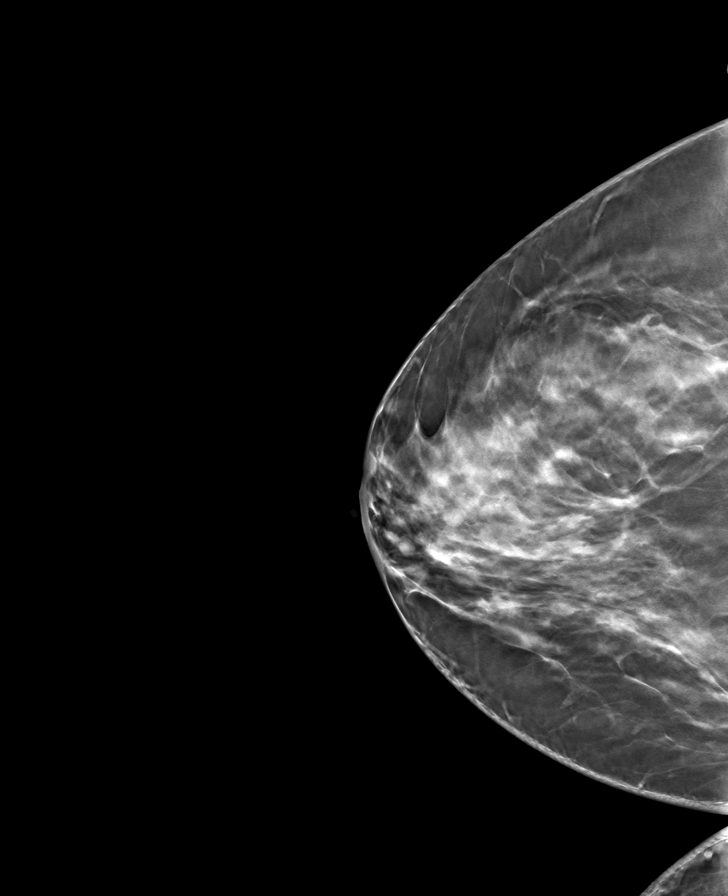

[R MLO tomo · tomo slice 39/78.0]
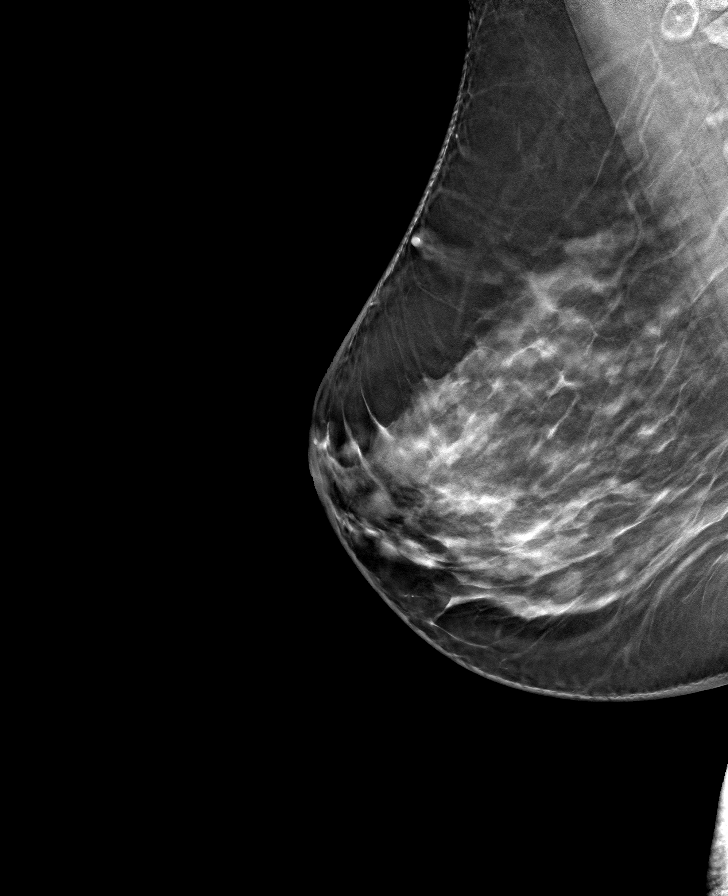

[L MLO tomo · tomo slice 41/82.0]
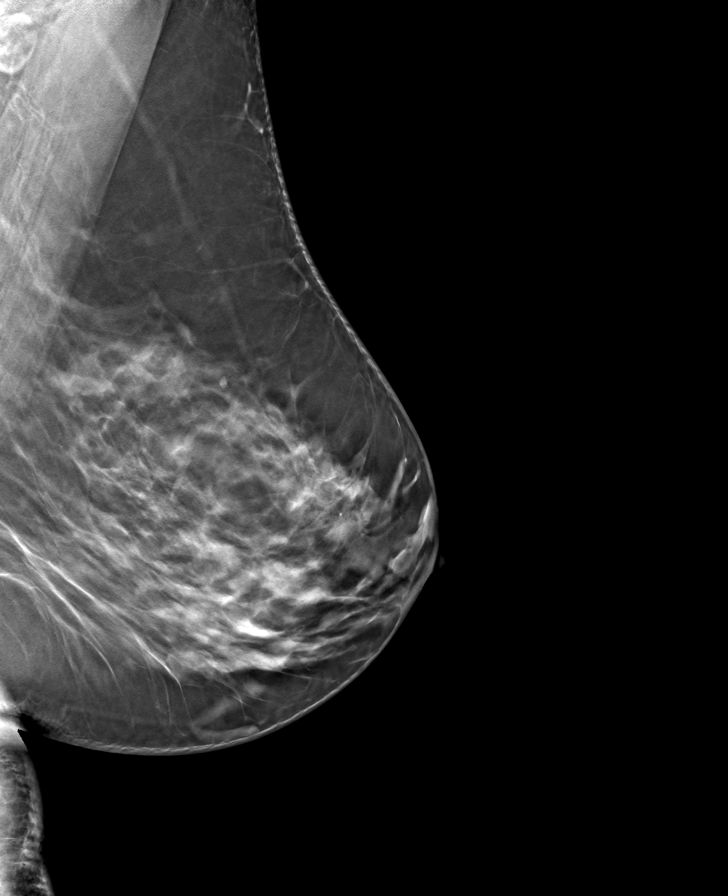

[L CC tomo · tomo slice 39/78.0]
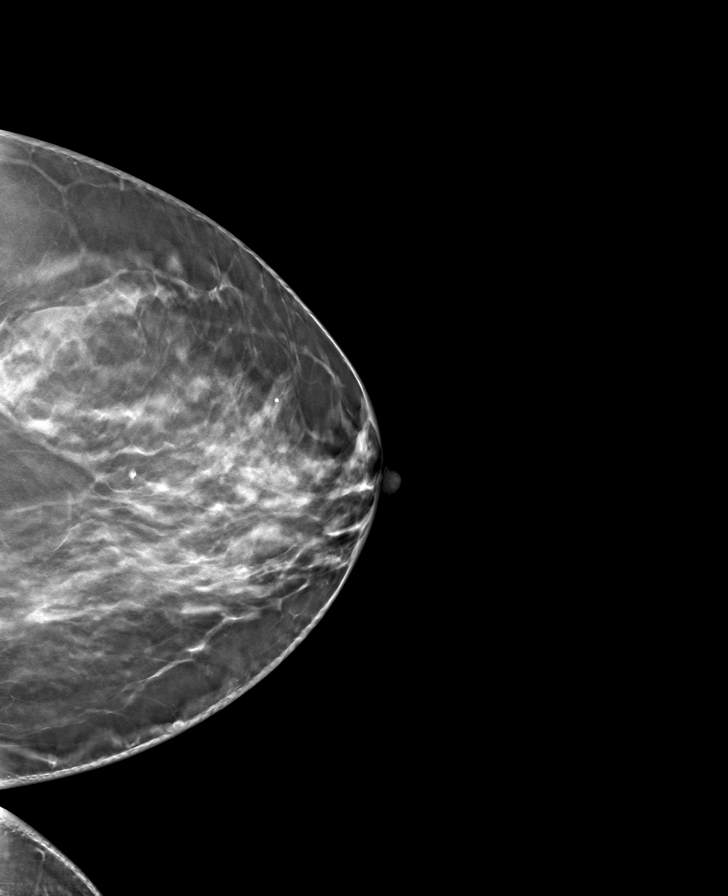

[8 of 24 positions shown; findings below may reference images not displayed]

ACR Breast Density Category c: The breast tissue is heterogeneously
dense, which may obscure small masses.
FINDINGS: There are no findings suspicious for malignancy. Images were
processed with CAD.
IMPRESSION: No mammographic evidence of malignancy. A result letter of this
screening mammogram will be mailed directly to the patient.

RECOMMENDATION:
Screening mammogram in one year. (Code:FT-U-LHB)

BI-RADS CATEGORY  1: Negative.

## 2022-06-12 ENCOUNTER — Other Ambulatory Visit: Payer: Self-pay | Admitting: Family Medicine

## 2022-06-12 DIAGNOSIS — Z1231 Encounter for screening mammogram for malignant neoplasm of breast: Secondary | ICD-10-CM

## 2022-06-19 ENCOUNTER — Ambulatory Visit: Payer: BC Managed Care – PPO | Admitting: Dermatology

## 2022-06-19 DIAGNOSIS — L738 Other specified follicular disorders: Secondary | ICD-10-CM | POA: Diagnosis not present

## 2022-06-19 DIAGNOSIS — L821 Other seborrheic keratosis: Secondary | ICD-10-CM

## 2022-06-19 DIAGNOSIS — L814 Other melanin hyperpigmentation: Secondary | ICD-10-CM

## 2022-06-19 DIAGNOSIS — D239 Other benign neoplasm of skin, unspecified: Secondary | ICD-10-CM

## 2022-06-19 DIAGNOSIS — Z1283 Encounter for screening for malignant neoplasm of skin: Secondary | ICD-10-CM | POA: Diagnosis not present

## 2022-06-19 DIAGNOSIS — D234 Other benign neoplasm of skin of scalp and neck: Secondary | ICD-10-CM | POA: Diagnosis not present

## 2022-06-19 DIAGNOSIS — L578 Other skin changes due to chronic exposure to nonionizing radiation: Secondary | ICD-10-CM

## 2022-06-19 DIAGNOSIS — D229 Melanocytic nevi, unspecified: Secondary | ICD-10-CM

## 2022-06-19 NOTE — Progress Notes (Signed)
Follow-Up Visit   Subjective  Traci Gillespie is a 62 y.o. female who presents for the following: TBSE (No hx skin cancer. Patient does have a spot at back that she thinks may have gotten larger. Patient has been treated for seb derm in the past but advises it only flares when she eats peanut M&M's. ).  The patient presents for Total-Body Skin Exam (TBSE) for skin cancer screening and mole check.  The patient has spots, moles and lesions to be evaluated, some may be new or changing and the patient has concerns that these could be cancer.   The following portions of the chart were reviewed this encounter and updated as appropriate:       Review of Systems:  No other skin or systemic complaints except as noted in HPI or Assessment and Plan.  Objective  Well appearing patient in no apparent distress; mood and affect are within normal limits.  A full examination was performed including scalp, head, eyes, ears, nose, lips, neck, chest, axillae, abdomen, back, buttocks, bilateral upper extremities, bilateral lower extremities, hands, feet, fingers, toes, fingernails, and toenails. All findings within normal limits unless otherwise noted below.  left scapula Firm pink/brown papulenodule with dimple sign.   face Small yellow papules with a central dell.     Assessment & Plan  Dermatofibroma left scapula  A dermatofibroma is a benign growth possibly related to trauma, such as an insect bite, cut from shaving, or inflamed acne-type bump.  Treatment options to remove include shave or excision with resulting scar and risk of recurrence.  Since not bothersome, will observe for now.   Sebaceous hyperplasia face  Benign-appearing.  Observation.  Call clinic for new or changing lesions.  Patient advised would be cosmetic if she wanted to treat (ED) and is not covered by insurance.  Discussed cosmetic procedure, noncovered.  $60 for 1st lesion and $15 for each additional lesion if done on the  same day.  Maximum charge $350.  One touch-up treatment included no charge. Discussed risks of treatment including dyspigmentation, small scar, and/or recurrence. Recommend daily broad spectrum sunscreen SPF 30+/photoprotection to treated areas once healed.     Lentigines - Scattered tan macules - Due to sun exposure - Benign-appearing, observe - Recommend daily broad spectrum sunscreen SPF 30+ to sun-exposed areas, reapply every 2 hours as needed. - Call for any changes  Seborrheic Keratoses - Stuck-on, waxy, tan-brown papules including R back. - Benign-appearing - Discussed benign etiology and prognosis. - Observe - Call for any changes  Melanocytic Nevi - Tan-brown and/or pink-flesh-colored symmetric macules and papules - Benign appearing on exam today - Observation - Call clinic for new or changing moles - Recommend daily use of broad spectrum spf 30+ sunscreen to sun-exposed areas.   Hemangiomas - Red papules - Discussed benign nature - Observe - Call for any changes  Actinic Damage - Chronic condition, secondary to cumulative UV/sun exposure - diffuse scaly erythematous macules with underlying dyspigmentation - Recommend daily broad spectrum sunscreen SPF 30+ to sun-exposed areas, reapply every 2 hours as needed.  - Staying in the shade or wearing long sleeves, sun glasses (UVA+UVB protection) and wide brim hats (4-inch brim around the entire circumference of the hat) are also recommended for sun protection.  - Call for new or changing lesions.  Skin cancer screening performed today.  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  Graciella Belton, RMA, am acting as scribe for Brendolyn Patty, MD .  Documentation: I have reviewed the above documentation for accuracy and completeness, and I agree with the above.  Brendolyn Patty MD

## 2022-06-19 NOTE — Patient Instructions (Addendum)
Seborrheic Keratosis  What causes seborrheic keratoses? Seborrheic keratoses are harmless, common skin growths that first appear during adult life.  As time goes by, more growths appear.  Some people may develop a large number of them.  Seborrheic keratoses appear on both covered and uncovered body parts.  They are not caused by sunlight.  The tendency to develop seborrheic keratoses can be inherited.  They vary in color from skin-colored to gray, brown, or even black.  They can be either smooth or have a rough, warty surface.   Seborrheic keratoses are superficial and look as if they were stuck on the skin.  Under the microscope this type of keratosis looks like layers upon layers of skin.  That is why at times the top layer may seem to fall off, but the rest of the growth remains and re-grows.    Treatment Seborrheic keratoses do not need to be treated, but can easily be removed in the office.  Seborrheic keratoses often cause symptoms when they rub on clothing or jewelry.  Lesions can be in the way of shaving.  If they become inflamed, they can cause itching, soreness, or burning.  Removal of a seborrheic keratosis can be accomplished by freezing, burning, or surgery. If any spot bleeds, scabs, or grows rapidly, please return to have it checked, as these can be an indication of a skin cancer.  Melanoma ABCDEs  Melanoma is the most dangerous type of skin cancer, and is the leading cause of death from skin disease.  You are more likely to develop melanoma if you: Have light-colored skin, light-colored eyes, or red or blond hair Spend a lot of time in the sun Tan regularly, either outdoors or in a tanning bed Have had blistering sunburns, especially during childhood Have a close family member who has had a melanoma Have atypical moles or large birthmarks  Early detection of melanoma is key since treatment is typically straightforward and cure rates are extremely high if we catch it early.   The  first sign of melanoma is often a change in a mole or a new dark spot.  The ABCDE system is a way of remembering the signs of melanoma.  A for asymmetry:  The two halves do not match. B for border:  The edges of the growth are irregular. C for color:  A mixture of colors are present instead of an even brown color. D for diameter:  Melanomas are usually (but not always) greater than 58m - the size of a pencil eraser. E for evolution:  The spot keeps changing in size, shape, and color.  Please check your skin once per month between visits. You can use a small mirror in front and a large mirror behind you to keep an eye on the back side or your body.   If you see any new or changing lesions before your next follow-up, please call to schedule a visit.  Please continue daily skin protection including broad spectrum sunscreen SPF 30+ to sun-exposed areas, reapplying every 2 hours as needed when you're outdoors.    Due to recent changes in healthcare laws, you may see results of your pathology and/or laboratory studies on MyChart before the doctors have had a chance to review them. We understand that in some cases there may be results that are confusing or concerning to you. Please understand that not all results are received at the same time and often the doctors may need to interpret multiple results in order to  with the best plan of care or course of treatment. Therefore, we ask that you please give us 2 business days to thoroughly review all your results before contacting the office for clarification. Should we see a critical lab result, you will be contacted sooner.   If You Need Anything After Your Visit  If you have any questions or concerns for your doctor, please call our main line at [phone] and press option 4 to reach your doctor's medical assistant. If no one answers, please leave a voicemail as directed and we will return your call as soon as possible. Messages left after 4  pm will be answered the following business day.   You may also send us a message via MyChart. We typically respond to MyChart messages within 1-2 business days.  For prescription refills, please ask your pharmacy to contact our office. Our fax number is [phone].  If you have an urgent issue when the clinic is closed that cannot wait until the next business day, you can page your doctor at the number below.    Please note that while we do our best to be available for urgent issues outside of office hours, we are not available 24/7.   If you have an urgent issue and are unable to reach us, you may choose to seek medical care at your doctor's office, retail clinic, urgent care center, or emergency room.  If you have a medical emergency, please immediately call 911 or go to the emergency department.  Pager Numbers  - Dr. Kowalski: [phone]  - Dr. Moye: [phone]  - Dr. Stewart: [phone]  In the event of inclement weather, please call our main line at [phone] for an update on the status of any delays or closures.  Dermatology Medication Tips: Please keep the boxes that topical medications come in in order to help keep track of the instructions about where and how to use these. Pharmacies typically print the medication instructions only on the boxes and not directly on the medication tubes.   If your medication is too expensive, please contact our office at [phone] option 4 or send us a message through MyChart.   We are unable to tell what your co-pay for medications will be in advance as this is different depending on your insurance coverage. However, we may be able to find a substitute medication at lower cost or fill out paperwork to get insurance to cover a needed medication.   If a prior authorization is required to get your medication covered by your insurance company, please allow us 1-2 business days to complete this process.  Drug prices often vary  depending on where the prescription is filled and some pharmacies may offer cheaper prices.  The website www.goodrx.com contains coupons for medications through different pharmacies. The prices here do not account for what the cost may be with help from insurance (it may be cheaper with your insurance), but the website can give you the price if you did not use any insurance.  - You can print the associated coupon and take it with your prescription to the pharmacy.  - You may also stop by our office during regular business hours and pick up a GoodRx coupon card.  - If you need your prescription sent electronically to a different pharmacy, notify our office through [facility] MyChart or by phone at [phone] option 4.     Si Usted Necesita Algo Despus de Su Visita  Tambin puede enviarnos un mensaje a travs   de MyChart. Por lo general respondemos a los mensajes de MyChart en el transcurso de 1 a 2 das hbiles.  Para renovar recetas, por favor pida a su farmacia que se ponga en contacto con nuestra oficina. Nuestro nmero de fax es el [phone].  Si tiene un asunto urgente cuando la clnica est cerrada y que no puede esperar hasta el siguiente da hbil, puede llamar/localizar a su doctor(a) al nmero que aparece a continuacin.   Por favor, tenga en cuenta que aunque hacemos todo lo posible para estar disponibles para asuntos urgentes fuera del horario de oficina, no estamos disponibles las 24 horas del da, los 7 das de la semana.   Si tiene un problema urgente y no puede comunicarse con nosotros, puede optar por buscar atencin mdica  en el consultorio de su doctor(a), en una clnica privada, en un centro de atencin urgente o en una sala de emergencias.  Si tiene una emergencia mdica, por favor llame inmediatamente al 911 o vaya a la sala de emergencias.  Nmeros de bper  - Dr. Kowalski: [phone]  - Dra. Moye: [phone]  - Dra. Stewart: [phone]  En caso de  inclemencias del tiempo, por favor llame a nuestra lnea principal al [phone] para una actualizacin sobre el estado de cualquier retraso o cierre.  Consejos para la medicacin en dermatologa: Por favor, guarde las cajas en las que vienen los medicamentos de uso tpico para ayudarle a seguir las instrucciones sobre dnde y cmo usarlos. Las farmacias generalmente imprimen las instrucciones del medicamento slo en las cajas y no directamente en los tubos del medicamento.   Si su medicamento es muy caro, por favor, pngase en contacto con nuestra oficina llamando al [phone] y presione la opcin 4 o envenos un mensaje a travs de MyChart.   No podemos decirle cul ser su copago por los medicamentos por adelantado ya que esto es diferente dependiendo de la cobertura de su seguro. Sin embargo, es posible que podamos encontrar un medicamento sustituto a menor costo o llenar un formulario para que el seguro cubra el medicamento que se considera necesario.   Si se requiere una autorizacin previa para que su compaa de seguros cubra su medicamento, por favor permtanos de 1 a 2 das hbiles para completar este proceso.  Los precios de los medicamentos varan con frecuencia dependiendo del lugar de dnde se surte la receta y alguna farmacias pueden ofrecer precios ms baratos.  El sitio web www.goodrx.com tiene cupones para medicamentos de diferentes farmacias. Los precios aqu no tienen en cuenta lo que podra costar con la ayuda del seguro (puede ser ms barato con su seguro), pero el sitio web puede darle el precio si no utiliz ningn seguro.  - Puede imprimir el cupn correspondiente y llevarlo con su receta a la farmacia.  - Tambin puede pasar por nuestra oficina durante el horario de atencin regular y recoger una tarjeta de cupones de GoodRx.  - Si necesita que su receta se enve electrnicamente a una farmacia diferente, informe a nuestra oficina a travs de MyChart de  o  por telfono llamando al [phone] y presione la opcin 4.  

## 2022-06-29 DIAGNOSIS — K5730 Diverticulosis of large intestine without perforation or abscess without bleeding: Secondary | ICD-10-CM

## 2022-06-29 DIAGNOSIS — K640 First degree hemorrhoids: Secondary | ICD-10-CM

## 2022-06-29 DIAGNOSIS — K5289 Other specified noninfective gastroenteritis and colitis: Secondary | ICD-10-CM

## 2022-06-29 DIAGNOSIS — Z86010 Personal history of colonic polyps: Secondary | ICD-10-CM

## 2022-07-12 ENCOUNTER — Ambulatory Visit
Admission: RE | Admit: 2022-07-12 | Discharge: 2022-07-12 | Disposition: A | Discharge status: Home / Self Care | Payer: BC Managed Care – PPO | Source: Ambulatory Visit | Attending: Family Medicine | Admitting: Family Medicine

## 2022-07-12 DIAGNOSIS — Z1231 Encounter for screening mammogram for malignant neoplasm of breast: Secondary | ICD-10-CM | POA: Insufficient documentation

## 2023-02-04 DIAGNOSIS — Z0000 Encounter for general adult medical examination without abnormal findings: Secondary | ICD-10-CM | POA: Diagnosis not present

## 2023-02-04 DIAGNOSIS — Z23 Encounter for immunization: Secondary | ICD-10-CM | POA: Diagnosis not present

## 2023-02-04 DIAGNOSIS — E2839 Other primary ovarian failure: Secondary | ICD-10-CM | POA: Diagnosis not present

## 2023-02-04 DIAGNOSIS — I10 Essential (primary) hypertension: Secondary | ICD-10-CM | POA: Diagnosis not present

## 2023-02-04 DIAGNOSIS — Z1331 Encounter for screening for depression: Secondary | ICD-10-CM | POA: Diagnosis not present

## 2023-02-04 DIAGNOSIS — E119 Type 2 diabetes mellitus without complications: Secondary | ICD-10-CM | POA: Diagnosis not present

## 2023-03-06 DIAGNOSIS — I10 Essential (primary) hypertension: Secondary | ICD-10-CM | POA: Diagnosis not present

## 2023-04-15 DIAGNOSIS — R0683 Snoring: Secondary | ICD-10-CM | POA: Diagnosis not present

## 2023-04-15 DIAGNOSIS — I10 Essential (primary) hypertension: Secondary | ICD-10-CM | POA: Diagnosis not present

## 2023-06-05 ENCOUNTER — Other Ambulatory Visit: Payer: Self-pay | Admitting: Family Medicine

## 2023-06-05 DIAGNOSIS — Z1231 Encounter for screening mammogram for malignant neoplasm of breast: Secondary | ICD-10-CM

## 2023-06-18 ENCOUNTER — Encounter: Payer: BC Managed Care – PPO | Admitting: Dermatology

## 2023-07-25 ENCOUNTER — Ambulatory Visit
Admission: RE | Admit: 2023-07-25 | Discharge: 2023-07-25 | Disposition: A | Discharge status: Home / Self Care | Payer: Medicare Other | Source: Ambulatory Visit | Attending: Family Medicine | Admitting: Family Medicine

## 2023-07-25 DIAGNOSIS — Z1231 Encounter for screening mammogram for malignant neoplasm of breast: Secondary | ICD-10-CM | POA: Insufficient documentation

## 2023-07-29 ENCOUNTER — Ambulatory Visit (INDEPENDENT_AMBULATORY_CARE_PROVIDER_SITE_OTHER): Payer: Self-pay | Admitting: Internal Medicine

## 2023-07-29 VITALS — BP 135/76 | HR 69 | Resp 12 | Ht 70.0 in | Wt 200.1 lb

## 2023-07-29 DIAGNOSIS — I10 Essential (primary) hypertension: Secondary | ICD-10-CM | POA: Diagnosis not present

## 2023-07-29 DIAGNOSIS — R0681 Apnea, not elsewhere classified: Secondary | ICD-10-CM

## 2023-07-29 NOTE — Progress Notes (Unsigned)
 Sleep Medicine   Office Visit  Patient Name: Traci Gillespie DOB: [birth year] MRN [id number]    Chief Complaint: sleep evaluation   Brief History:  Traci Gillespie presents for an initial sleep evaluation with a long  history of snoring and waking not rested.  Patient reports her sleep quality is poor due to waking tired and choking episodes in her sleep. This is noted most nights. The patient's bed partner reports  loud snoring and witnessed apnea at night. The patient relates the following symptoms: waking tired, loud snoring that wakes her, choking, some headaches, some brain fog and EDS are also present. The patient goes to sleep at 9o-1:00am and wakes up at  7:30-8:30am. She reports waking once to use the restroom. Sleep quality is similar when outside home environment.  Patient has noted no restlessness of her legs at night.  The patient  relates no unusual behavior during the night.  The patient denies a history of psychiatric problems. The Epworth Sleepiness Score is 18 out of 24 .  The patient relates  Cardiovascular risk factors include:  hypertension.   ROS  General: (-) fever, (-) chills, (-) night sweat Nose and Sinuses: (-) nasal stuffiness or itchiness, (-) postnasal drip, (-) nosebleeds, (-) sinus trouble. Mouth and Throat: (-) sore throat, (-) hoarseness. Neck: (-) swollen glands, (-) enlarged thyroid, (-) neck pain. Respiratory: + cough, - shortness of breath, - wheezing. Neurologic: - numbness, - tingling. Psychiatric: - anxiety, - depression Sleep behavior: -sleep paralysis -hypnogogic hallucinations -dream enactment      -vivid dreams -cataplexy -night terrors -sleep walking   Current Medication: Outpatient Encounter Medications as of 07/29/2023  Medication Sig   estradiol (ESTRACE) 0.1 MG/GM vaginal cream 2 grams vaginally once daily for 1 week, then use twice weekly   clobetasol (TEMOVATE) 0.05 % external solution Apply 1 application topically 2 (two) times daily as needed. To  scalp . Avoid applying to face, groin, and axilla. Use as directed. Risk of skin atrophy with long-term use reviewed.   famotidine (PEPCID) 40 MG tablet Take 40 mg by mouth at bedtime.   hydrochlorothiazide (HYDRODIURIL) 25 MG tablet Take 25 mg by mouth daily.   losartan (COZAAR) 100 MG tablet Take 100 mg by mouth daily.   simvastatin (ZOCOR) 20 MG tablet    [DISCONTINUED] OZEMPIC, 0.25 OR 0.5 MG/DOSE, 2 MG/3ML SOPN Inject into the skin.   No facility-administered encounter medications on file as of 07/29/2023.    Surgical History: Past Surgical History:  Procedure Laterality Date   CESAREAN SECTION     COLONOSCOPY WITH PROPOFOL N/A 11/15/2016   Procedure: COLONOSCOPY WITH PROPOFOL;  Surgeon: Wyline Mood, MD;  Location: [facility] ENDOSCOPY;  Service: Endoscopy;  Laterality: N/A;    Medical History: Past Medical History:  Diagnosis Date   Anxiety    GERD (gastroesophageal reflux disease)     Family History: Non contributory to the present illness  Social History: Social History   Socioeconomic History   Marital status: Married    Spouse name: Not on file   Number of children: Not on file   Years of education: Not on file   Highest education level: Not on file  Occupational History   Not on file  Tobacco Use   Smoking status: Never   Smokeless tobacco: Never  Vaping Use   Vaping status: Never Used  Substance and Sexual Activity   Alcohol use: Yes   Drug use: No   Sexual activity: Not on file  Other Topics Concern  Not on file  Social History Narrative   Not on file   Social Drivers of Health   Financial Resource Strain: Low Risk  (02/04/2023)   Received [facility]   Overall Financial Resource Strain (CARDIA)    Difficulty of Paying Living Expenses: Not hard at all  Food Insecurity: No Food Insecurity (02/04/2023)   Received from [facility]   Hunger Vital Sign    Worried About Running Out of Food in the Last Year: Never true     Ran Out of Food in the Last Year: Never true  Transportation Needs: No Transportation Needs (02/04/2023)   Received from [facility] - Transportation    In the past 12 months, has lack of transportation kept you from medical appointments or from getting medications?: No    Lack of Transportation (Non-Medical): No  Physical Activity: Not on file  Stress: Not on file  Social Connections: Not on file  Intimate Partner Violence: Not on file    Vital Signs: Blood pressure 135/76, pulse 69, resp. rate 12, height 5\' 10"  (1.778 m), weight 200 lb 1.6 oz (90.8 kg), SpO2 96%. Body mass index is 28.71 kg/m.   Examination: General Appearance: The patient is well-developed, well-nourished, and in no distress. Neck Circumference: 40.5cm Skin: Gross inspection of skin unremarkable. Head: normocephalic, no gross deformities. Eyes: no gross deformities noted. ENT: ears appear grossly normal Neurologic: Alert and oriented. No involuntary movements.    STOP BANG RISK ASSESSMENT S (snore) Have you been told that you snore?     YES   T (tired) Are you often tired, fatigued, or sleepy during the day?   YES  O (obstruction) Do you stop breathing, choke, or gasp during sleep? YES   P (pressure) Do you have or are you being treated for high blood pressure? YES   B (BMI) Is your body index greater than 35 kg/m? NO   A (age) Are you 65 years old or older? YES   N (neck) Do you have a neck circumference greater than 16 inches?   YES   G (gender) Are you a female? NO   TOTAL STOP/BANG "YES" ANSWERS 6                                                               A STOP-Bang score of 2 or less is considered low risk, and a score of 5 or more is high risk for having either moderate or severe OSA. For people who score 3 or 4, doctors may need to perform further assessment to determine how likely they are to have OSA.         EPWORTH SLEEPINESS SCALE:  Scale:  (0)= no  chance of dozing; (1)= slight chance of dozing; (2)= moderate chance of dozing; (3)= high chance of dozing  Chance  Situtation    Sitting and reading: 3    Watching TV: 3    Sitting Inactive in public: 2    As a passenger in car: 3      Lying down to rest: 3    Sitting and talking: 1    Sitting quielty after lunch: 3    In a car, stopped in traffic: 0  TOTAL SCORE:   18 out of 24    SLEEP STUDIES:  None on file   LABS: No results found for this or any previous visit (from the past 2160 hours).  Radiology: MM 3D SCREENING MAMMOGRAM BILATERAL BREAST Result Date: 07/29/2023 CLINICAL DATA:  Screening. EXAM: DIGITAL SCREENING BILATERAL MAMMOGRAM WITH TOMOSYNTHESIS AND CAD TECHNIQUE: Bilateral screening digital craniocaudal and mediolateral oblique mammograms were obtained. Bilateral screening digital breast tomosynthesis was performed. The images were evaluated with computer-aided detection. COMPARISON:  Previous exam(s). ACR Breast Density Category c: The breasts are heterogeneously dense, which may obscure small masses. FINDINGS: There are no findings suspicious for malignancy. IMPRESSION: No mammographic evidence of malignancy. A result letter of this screening mammogram will be mailed directly to the patient. RECOMMENDATION: Screening mammogram in one year. (Code:SM-B-01Y) BI-RADS CATEGORY  1: Negative. Electronically Signed   By: Frederico Hamman M.D.   On: 07/29/2023 11:53    No results found.  MM 3D SCREENING MAMMOGRAM BILATERAL BREAST Result Date: 07/29/2023 CLINICAL DATA:  Screening. EXAM: DIGITAL SCREENING BILATERAL MAMMOGRAM WITH TOMOSYNTHESIS AND CAD TECHNIQUE: Bilateral screening digital craniocaudal and mediolateral oblique mammograms were obtained. Bilateral screening digital breast tomosynthesis was performed. The images were evaluated with computer-aided detection. COMPARISON:  Previous exam(s). ACR Breast Density Category c: The breasts are heterogeneously dense,  which may obscure small masses. FINDINGS: There are no findings suspicious for malignancy. IMPRESSION: No mammographic evidence of malignancy. A result letter of this screening mammogram will be mailed directly to the patient. RECOMMENDATION: Screening mammogram in one year. (Code:SM-B-01Y) BI-RADS CATEGORY  1: Negative. Electronically Signed   By: Frederico Hamman M.D.   On: 07/29/2023 11:53      Assessment and Plan: Patient Active Problem List   Diagnosis Date Noted   Witnessed episode of apnea 07/29/2023   Essential hypertension 02/04/2023   Type 2 diabetes mellitus without complication, without long-term current use of insulin (HCC) 12/06/2019   1. Witnessed episode of apnea (Primary) PLAN OSA:   Patient evaluation suggests high risk of sleep disordered breathing due to snoring, gasping, choking, witnessed apnea, brain fog, nonrestorative sleep and daytime sleepiness.  Patient has comorbid cardiovascular risk factors including: hypertension which could be exacerbated by pathologic sleep-disordered breathing.  Suggest: PSG to assess/treat the patient's sleep disordered breathing. The patient was also counselled on weight loss to optimize sleep health.   2. Essential hypertension Obesity Counseling: Had a lengthy discussion regarding patients BMI and weight issues. Patient was instructed on portion control as well as increased activity. Also discussed caloric restrictions with trying to maintain intake less than 2000 Kcal. Discussions were made in accordance with the 5As of weight management. Simple actions such as not eating late and if able to, taking a walk is suggested.     General Counseling: I have discussed the findings of the evaluation and examination with Traci Gillespie.  I have also discussed any further diagnostic evaluation thatmay be needed or ordered today. Traci Gillespie verbalizes understanding of the findings of todays visit. We also reviewed her medications today and discussed drug  interactions and side effects including but not limited excessive drowsiness and altered mental states. We also discussed that there is always a risk not just to her but also people around her. she has been encouraged to call the office with any questions or concerns that should arise related to todays visit.  No orders of the defined types were placed in this encounter.       I have personally obtained a history, evaluated  the patient, evaluated pertinent data, formulated the assessment and plan and placed orders.   This patient was seen today by Emmaline Kluver, PA-C in collaboration with Dr. Freda Munro.   Yevonne Pax, MD [facility] Diplomate ABMS Pulmonary and Critical Care Medicine Sleep medicine

## 2024-04-26 NOTE — Progress Notes (Signed)
 [facility] [address] [place], [place] [address]  Pulmonary Sleep Medicine   Office Visit Note  Patient Name: Traci Gillespie DOB: 07-26-1960 MRN [id number]    Chief Complaint: Obstructive Sleep Apnea visit  Brief History:  Traci Gillespie is seen today for a follow up visit for APAP@ 13-20 cmH2O. The patient has a 11 month history of sleep apnea. Patient is using PAP nightly.  The patient feels rested after sleeping with PAP.  The patient reports benefiting from PAP use. Reported sleepiness is improved and the Epworth Sleepiness Score is 5 out of 24. The patient will rarely take naps. The patient complains of the following: none.  The compliance download shows 100% compliance with an average use time of 6 hours 53 minutes. The AHI is 1.7.  The patient does not complain of limb movements disrupting sleep. The patient continues to require PAP therapy in order to eliminate sleep apnea.   ROS  General: (-) fever, (-) chills, (-) night sweat Nose and Sinuses: (-) nasal stuffiness or itchiness, (-) postnasal drip, (-) nosebleeds, (-) sinus trouble. Mouth and Throat: (-) sore throat, (-) hoarseness. Neck: (-) swollen glands, (-) enlarged thyroid, (-) neck pain. Respiratory: - cough, - shortness of breath, - wheezing. Neurologic: - numbness, - tingling. Psychiatric: - anxiety, - depression   Current Medication: Outpatient Encounter Medications as of 04/27/2024  Medication Sig   pantoprazole (PROTONIX) 20 MG tablet Take 20 mg by mouth.   clobetasol  (TEMOVATE ) 0.05 % external solution Apply 1 application topically 2 (two) times daily as needed. To scalp . Avoid applying to face, groin, and axilla. Use as directed. Risk of skin atrophy with long-term use reviewed.   estradiol (ESTRACE) 0.1 MG/GM vaginal cream 2 grams vaginally once daily for 1 week, then use twice weekly   famotidine (PEPCID) 40 MG tablet Take 40 mg by mouth at bedtime.   hydrochlorothiazide (HYDRODIURIL) 25 MG tablet Take  25 mg by mouth daily.   losartan (COZAAR) 100 MG tablet Take 100 mg by mouth daily.   OZEMPIC, 1 MG/DOSE, 4 MG/3ML SOPN Inject 1 mg into the skin once a week.   simvastatin (ZOCOR) 20 MG tablet    No facility-administered encounter medications on file as of 04/27/2024.    Surgical History: Past Surgical History:  Procedure Laterality Date   CESAREAN SECTION     COLONOSCOPY WITH PROPOFOL  N/A 11/15/2016   Procedure: COLONOSCOPY WITH PROPOFOL ;  Surgeon: Therisa Bi, MD;  Location: [facility] ENDOSCOPY;  Service: Endoscopy;  Laterality: N/A;    Medical History: Past Medical History:  Diagnosis Date   Anxiety    GERD (gastroesophageal reflux disease)     Family History: Non contributory to the present illness  Social History: Social History   Socioeconomic History   Marital status: Married    Spouse name: Not on file   Number of children: Not on file   Years of education: Not on file   Highest education level: Not on file  Occupational History   Not on file  Tobacco Use   Smoking status: Never   Smokeless tobacco: Never  Vaping Use   Vaping status: Never Used  Substance and Sexual Activity   Alcohol use: Yes   Drug use: No   Sexual activity: Not on file  Other Topics Concern   Not on file  Social History Narrative   Not on file   Social Drivers of Health   Financial Resource Strain: Low Risk  (02/22/2024)   Received from [facility]   Overall Financial Resource Strain (CARDIA)    Difficulty of Paying Living Expenses: Not hard at all  Food Insecurity: No Food Insecurity (02/22/2024)   Received from [facility]   Hunger Vital Sign    Within the past 12 months, you worried that your food would run out before you got the money to buy more.: Never true    Within the past 12 months, the food you bought just didn't last and you didn't have money to get more.: Never true  Transportation Needs: No Transportation Needs (02/22/2024)   Received  from [facility] - Transportation    In the past 12 months, has lack of transportation kept you from medical appointments or from getting medications?: No    Lack of Transportation (Non-Medical): No  Physical Activity: Not on file  Stress: Not on file  Social Connections: Not on file  Intimate Partner Violence: Not on file    Vital Signs: Blood pressure 120/74, pulse 66, resp. rate 16, height 5' 9 (1.753 m), weight 199 lb (90.3 kg), SpO2 96%. Body mass index is 29.39 kg/m.    Examination: General Appearance: The patient is well-developed, well-nourished, and in no distress. Neck Circumference: 40.5 cm Skin: Gross inspection of skin unremarkable. Head: normocephalic, no gross deformities. Eyes: no gross deformities noted. ENT: ears appear grossly normal Neurologic: Alert and oriented. No involuntary movements.  STOP BANG RISK ASSESSMENT S (snore) Have you been told that you snore?     NO   T (tired) Are you often tired, fatigued, or sleepy during the day?   NO  O (obstruction) Do you stop breathing, choke, or gasp during sleep? NO   P (pressure) Do you have or are you being treated for high blood pressure? YES   B (BMI) Is your body index greater than 35 kg/m? NO   A (age) Are you 64 years old or older? YES   N (neck) Do you have a neck circumference greater than 16 inches?   NO   G (gender) Are you a female? NO   TOTAL STOP/BANG "YES" ANSWERS 2       A STOP-Bang score of 2 or less is considered low risk, and a score of 5 or more is high risk for having either moderate or severe OSA. For people who score 3 or 4, doctors may need to perform further assessment to determine how likely they are to have OSA.         EPWORTH SLEEPINESS SCALE:  Scale:  (0)= no chance of dozing; (1)= slight chance of dozing; (2)= moderate chance of dozing; (3)= high chance of dozing  Chance  Situtation    Sitting and reading: 1    Watching TV: 1    Sitting  Inactive in public: 0    As a passenger in car: 1      Lying down to rest: 2    Sitting and talking: 0    Sitting quielty after lunch: 0    In a car, stopped in traffic: 0   TOTAL SCORE:   5 out of 24    SLEEP STUDIES:  PSG (07/2023) AHI 50.3/hr, Supine AHI 78/hr, REM AHI 71.5/hr, min SpO2 79% Titration (09/2023) recommend repeating CPAP titration in the supine position w/ addition of V-Com adapter. Titration (10/2023) APAP@ 13-20 cmH2O   CPAP COMPLIANCE DATA:  Date Range: 01/23/2024- 04/21/2024  Average Daily Use: 6 hours 53 minutes  Median Use:  7 hours 15 minutes  Compliance for > 4 Hours: 100%  AHI: 1.7 respiratory events per hour  Days Used: 90/90 days  Mask Leak: 5.8  95th Percentile Pressure: 14.5         LABS: No results found for this or any previous visit (from the past 2160 hours).  Radiology: MM 3D SCREENING MAMMOGRAM BILATERAL BREAST Result Date: 07/29/2023 CLINICAL DATA:  Screening. EXAM: DIGITAL SCREENING BILATERAL MAMMOGRAM WITH TOMOSYNTHESIS AND CAD TECHNIQUE: Bilateral screening digital craniocaudal and mediolateral oblique mammograms were obtained. Bilateral screening digital breast tomosynthesis was performed. The images were evaluated with computer-aided detection. COMPARISON:  Previous exam(s). ACR Breast Density Category c: The breasts are heterogeneously dense, which may obscure small masses. FINDINGS: There are no findings suspicious for malignancy. IMPRESSION: No mammographic evidence of malignancy. A result letter of this screening mammogram will be mailed directly to the patient. RECOMMENDATION: Screening mammogram in one year. (Code:SM-B-01Y) BI-RADS CATEGORY  1: Negative. Electronically Signed   By: Rosaline Collet M.D.   On: 07/29/2023 11:53    No results found.  No results found.    Assessment and Plan: Patient Active Problem List   Diagnosis Date Noted   Witnessed episode of apnea 07/29/2023   Essential hypertension  02/04/2023   Type 2 diabetes mellitus without complication, without long-term current use of insulin (HCC) 12/06/2019   1. OSA (obstructive sleep apnea) (Primary) The patient does tolerate PAP and reports  benefit from PAP use. The patient was reminded how to clean equipment and advised to replace supplies routinely. The patient was also counselled on weight loss. The compliance is excellent. The AHI is 1.7.   OSA on cpap- controlled. Continue with excellent compliance with pap. CPAP continues to be medically necessary to treat this patient's OSA. F/u 25m  2. CPAP use counseling CPAP Counseling: had a lengthy discussion with the patient regarding the importance of PAP therapy in management of the sleep apnea. Patient appears to understand the risk factor reduction and also understands the risks associated with untreated sleep apnea. Patient will try to make a good faith effort to remain compliant with therapy. Also instructed the patient on proper cleaning of the device including the water must be changed daily if possible and use of distilled water is preferred. Patient understands that the machine should be regularly cleaned with appropriate recommended cleaning solutions that do not damage the PAP machine for example given white vinegar and water rinses. Other methods such as ozone treatment may not be as good as these simple methods to achieve cleaning.     General Counseling: I have discussed the findings of the evaluation and examination with Korin.  I have also discussed any further diagnostic evaluation thatmay be needed or ordered today. Teneshia verbalizes understanding of the findings of todays visit. We also reviewed her medications today and discussed drug interactions and side effects including but not limited excessive drowsiness and altered mental states. We also discussed that there is always a risk not just to her but also people around her. she has been encouraged to call the office with  any questions or concerns that should arise related to todays visit.  No orders of the defined types were placed in this encounter.       I have personally obtained a history, examined the patient, evaluated laboratory and imaging results, formulated the assessment and plan and placed orders. This patient was seen today by Lauraine Lay, PA-C in collaboration with Dr. Elfreda Bathe.   Zian Delair A  Fernand, MD [facility] Diplomate ABMS Pulmonary Critical Care Medicine and Sleep Medicine

## 2024-04-27 ENCOUNTER — Ambulatory Visit (INDEPENDENT_AMBULATORY_CARE_PROVIDER_SITE_OTHER): Admitting: Internal Medicine

## 2024-04-27 VITALS — BP 120/74 | HR 66 | Resp 16 | Ht 69.0 in | Wt 199.0 lb

## 2024-04-27 DIAGNOSIS — G4733 Obstructive sleep apnea (adult) (pediatric): Secondary | ICD-10-CM | POA: Insufficient documentation

## 2024-04-27 DIAGNOSIS — Z7189 Other specified counseling: Secondary | ICD-10-CM

## 2024-04-27 NOTE — Patient Instructions (Signed)
